# Patient Record
Sex: Male | Born: 1980 | Race: White | Hispanic: No | Marital: Single | Smoking: Current some day smoker
Health system: Southern US, Community
[De-identification: ages and names within clinical notes are randomized; demographics above are authoritative.]

## PROBLEM LIST (undated history)

## (undated) DIAGNOSIS — S52502A Unspecified fracture of the lower end of left radius, initial encounter for closed fracture: Secondary | ICD-10-CM

---

## 2014-04-10 ENCOUNTER — Emergency Department (HOSPITAL_COMMUNITY): Payer: No Typology Code available for payment source

## 2014-04-10 ENCOUNTER — Encounter (HOSPITAL_COMMUNITY): Payer: Self-pay | Admitting: Emergency Medicine

## 2014-04-10 ENCOUNTER — Emergency Department (HOSPITAL_COMMUNITY)
Admission: EM | Admit: 2014-04-10 | Discharge: 2014-04-10 | Disposition: A | Discharge status: Home / Self Care | Payer: No Typology Code available for payment source | Attending: Emergency Medicine | Admitting: Emergency Medicine

## 2014-04-10 DIAGNOSIS — S52509A Unspecified fracture of the lower end of unspecified radius, initial encounter for closed fracture: Secondary | ICD-10-CM | POA: Diagnosis not present

## 2014-04-10 DIAGNOSIS — S52609A Unspecified fracture of lower end of unspecified ulna, initial encounter for closed fracture: Principal | ICD-10-CM

## 2014-04-10 DIAGNOSIS — F17200 Nicotine dependence, unspecified, uncomplicated: Secondary | ICD-10-CM | POA: Diagnosis not present

## 2014-04-10 DIAGNOSIS — Y9389 Activity, other specified: Secondary | ICD-10-CM | POA: Insufficient documentation

## 2014-04-10 DIAGNOSIS — S52502A Unspecified fracture of the lower end of left radius, initial encounter for closed fracture: Secondary | ICD-10-CM

## 2014-04-10 DIAGNOSIS — S52612A Displaced fracture of left ulna styloid process, initial encounter for closed fracture: Secondary | ICD-10-CM

## 2014-04-10 DIAGNOSIS — S4980XA Other specified injuries of shoulder and upper arm, unspecified arm, initial encounter: Secondary | ICD-10-CM | POA: Insufficient documentation

## 2014-04-10 DIAGNOSIS — S46909A Unspecified injury of unspecified muscle, fascia and tendon at shoulder and upper arm level, unspecified arm, initial encounter: Secondary | ICD-10-CM | POA: Insufficient documentation

## 2014-04-10 DIAGNOSIS — Y92410 Unspecified street and highway as the place of occurrence of the external cause: Secondary | ICD-10-CM | POA: Diagnosis not present

## 2014-04-10 MED ORDER — HYDROCODONE-ACETAMINOPHEN 5-325 MG PO TABS
2.0000 | ORAL_TABLET | Freq: Once | ORAL | Status: AC
  Administered 2014-04-10: 2 via ORAL
  Filled 2014-04-10: qty 2

## 2014-04-10 MED ORDER — HYDROCODONE-ACETAMINOPHEN 5-325 MG PO TABS
2.0000 | ORAL_TABLET | ORAL | Status: DC | PRN
Start: 2014-04-10 — End: 2014-04-14
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
## 2014-04-10 NOTE — Discharge Instructions (Signed)
Follow up with Dr. Merlyn LotKuzma for further evaluation of your fracture. Take vicodin as needed for pain. Refer to attached documents for more information. Rest, ice and elevate your arm.

## 2014-04-10 NOTE — Progress Notes (Signed)
Orthopedic Tech Progress Note Patient Details:  Albert Mora 04-09-1981 [id number]  Ortho Devices Type of Ortho Device: Ace wrap;Volar splint Ortho Device/Splint Location: LUE Ortho Device/Splint Interventions: Ordered;Application   [name], [name] 04/10/2014, 8:41 PM
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
## 2014-04-10 NOTE — ED Notes (Signed)
Ortho Tech contacted to report order on arm splint.

## 2014-04-10 NOTE — ED Notes (Signed)
Declined W/C at D/C and was escorted to lobby by RN. 

## 2014-04-11 NOTE — ED Provider Notes (Signed)
Medical screening examination/treatment/procedure(s) were performed by non-physician practitioner and as supervising physician I was immediately available for consultation/collaboration.  Alohilani Levenhagen, MD 04/11/14 2022 

## 2014-04-14 ENCOUNTER — Ambulatory Visit
Admission: RE | Admit: 2014-04-14 | Discharge: 2014-04-14 | Disposition: A | Discharge status: Home / Self Care | Payer: Self-pay | Source: Ambulatory Visit | Attending: Orthopedic Surgery | Admitting: Orthopedic Surgery

## 2014-04-14 ENCOUNTER — Other Ambulatory Visit: Payer: Self-pay | Admitting: Orthopedic Surgery

## 2014-04-14 ENCOUNTER — Encounter (HOSPITAL_BASED_OUTPATIENT_CLINIC_OR_DEPARTMENT_OTHER): Payer: Self-pay | Admitting: *Deleted

## 2014-04-14 DIAGNOSIS — S52502P Unspecified fracture of the lower end of left radius, subsequent encounter for closed fracture with malunion: Secondary | ICD-10-CM

## 2014-04-15 ENCOUNTER — Encounter (HOSPITAL_BASED_OUTPATIENT_CLINIC_OR_DEPARTMENT_OTHER): Payer: Self-pay | Admitting: *Deleted

## 2014-04-15 ENCOUNTER — Ambulatory Visit (HOSPITAL_BASED_OUTPATIENT_CLINIC_OR_DEPARTMENT_OTHER): Payer: No Typology Code available for payment source | Admitting: Anesthesiology

## 2014-04-15 ENCOUNTER — Encounter (HOSPITAL_BASED_OUTPATIENT_CLINIC_OR_DEPARTMENT_OTHER)
Admission: RE | Disposition: A | Discharge status: Home / Self Care | Payer: Self-pay | Source: Ambulatory Visit | Attending: Orthopedic Surgery

## 2014-04-15 ENCOUNTER — Ambulatory Visit (HOSPITAL_BASED_OUTPATIENT_CLINIC_OR_DEPARTMENT_OTHER)
Admission: RE | Admit: 2014-04-15 | Discharge: 2014-04-15 | Disposition: A | Discharge status: Home / Self Care | Payer: No Typology Code available for payment source | Source: Ambulatory Visit | Attending: Orthopedic Surgery | Admitting: Orthopedic Surgery

## 2014-04-15 ENCOUNTER — Encounter (HOSPITAL_BASED_OUTPATIENT_CLINIC_OR_DEPARTMENT_OTHER): Payer: No Typology Code available for payment source | Admitting: Anesthesiology

## 2014-04-15 DIAGNOSIS — F17200 Nicotine dependence, unspecified, uncomplicated: Secondary | ICD-10-CM | POA: Insufficient documentation

## 2014-04-15 DIAGNOSIS — Y998 Other external cause status: Secondary | ICD-10-CM | POA: Diagnosis not present

## 2014-04-15 DIAGNOSIS — S52599A Other fractures of lower end of unspecified radius, initial encounter for closed fracture: Secondary | ICD-10-CM | POA: Insufficient documentation

## 2014-04-15 DIAGNOSIS — Y92410 Unspecified street and highway as the place of occurrence of the external cause: Secondary | ICD-10-CM | POA: Diagnosis not present

## 2014-04-15 HISTORY — DX: Unspecified fracture of the lower end of left radius, initial encounter for closed fracture: S52.502A

## 2014-04-15 HISTORY — PX: OPEN REDUCTION INTERNAL FIXATION (ORIF) DISTAL RADIAL FRACTURE: SHX5989

## 2014-04-15 SURGERY — OPEN REDUCTION INTERNAL FIXATION (ORIF) DISTAL RADIUS FRACTURE
Anesthesia: General | Site: Wrist | Laterality: Left

## 2014-04-15 MED ORDER — 0.9 % SODIUM CHLORIDE (POUR BTL) OPTIME
TOPICAL | Status: DC | PRN
  Administered 2014-04-15: 500 mL

## 2014-04-15 MED ORDER — FENTANYL CITRATE 0.05 MG/ML IJ SOLN
INTRAMUSCULAR | Status: AC
  Filled 2014-04-15: qty 4

## 2014-04-15 MED ORDER — CEFAZOLIN SODIUM-DEXTROSE 2-3 GM-% IV SOLR
2.0000 g | INTRAVENOUS | Status: AC
  Administered 2014-04-15: 2 g via INTRAVENOUS

## 2014-04-15 MED ORDER — MIDAZOLAM HCL 2 MG/2ML IJ SOLN
1.0000 mg | INTRAMUSCULAR | Status: DC | PRN
  Administered 2014-04-15: 2 mg via INTRAVENOUS

## 2014-04-15 MED ORDER — HYDROMORPHONE HCL PF 1 MG/ML IJ SOLN: 0.2500 mg | INTRAMUSCULAR | Status: DC | PRN

## 2014-04-15 MED ORDER — OXYCODONE HCL 5 MG/5ML PO SOLN: 5.0000 mg | Freq: Once | ORAL | Status: DC | PRN

## 2014-04-15 MED ORDER — MIDAZOLAM HCL 2 MG/2ML IJ SOLN
INTRAMUSCULAR | Status: AC
  Filled 2014-04-15: qty 2

## 2014-04-15 MED ORDER — FENTANYL CITRATE 0.05 MG/ML IJ SOLN
INTRAMUSCULAR | Status: AC
  Filled 2014-04-15: qty 2

## 2014-04-15 MED ORDER — DEXAMETHASONE SODIUM PHOSPHATE 4 MG/ML IJ SOLN
INTRAMUSCULAR | Status: DC | PRN
  Administered 2014-04-15: 10 mg via INTRAVENOUS

## 2014-04-15 MED ORDER — CEFAZOLIN SODIUM-DEXTROSE 2-3 GM-% IV SOLR
INTRAVENOUS | Status: AC
  Filled 2014-04-15: qty 50

## 2014-04-15 MED ORDER — ONDANSETRON HCL 4 MG/2ML IJ SOLN
INTRAMUSCULAR | Status: DC | PRN
  Administered 2014-04-15: 4 mg via INTRAVENOUS

## 2014-04-15 MED ORDER — LACTATED RINGERS IV SOLN
INTRAVENOUS | Status: DC
  Administered 2014-04-15: 13:00:00 via INTRAVENOUS

## 2014-04-15 MED ORDER — BUPIVACAINE-EPINEPHRINE (PF) 0.5% -1:200000 IJ SOLN
INTRAMUSCULAR | Status: DC | PRN
  Administered 2014-04-15: 25 mL via PERINEURAL

## 2014-04-15 MED ORDER — OXYCODONE-ACETAMINOPHEN 5-325 MG PO TABS: ORAL_TABLET | ORAL | Status: AC

## 2014-04-15 MED ORDER — FENTANYL CITRATE 0.05 MG/ML IJ SOLN
50.0000 ug | INTRAMUSCULAR | Status: DC | PRN
  Administered 2014-04-15: 100 ug via INTRAVENOUS

## 2014-04-15 MED ORDER — ONDANSETRON HCL 4 MG/2ML IJ SOLN: 4.0000 mg | Freq: Once | INTRAMUSCULAR | Status: DC | PRN

## 2014-04-15 MED ORDER — OXYCODONE HCL 5 MG PO TABS: 5.0000 mg | ORAL_TABLET | Freq: Once | ORAL | Status: DC | PRN

## 2014-04-15 MED ORDER — CHLORHEXIDINE GLUCONATE 4 % EX LIQD: 60.0000 mL | Freq: Once | CUTANEOUS | Status: DC

## 2014-04-15 MED ORDER — PROPOFOL 10 MG/ML IV BOLUS
INTRAVENOUS | Status: DC | PRN
  Administered 2014-04-15: 300 mg via INTRAVENOUS

## 2014-04-15 MED ORDER — OXYCODONE HCL 5 MG PO TABS
5.0000 mg | ORAL_TABLET | Freq: Once | ORAL | Status: DC | PRN
Start: 2014-04-15 — End: 2014-04-15

## 2014-04-15 MED ORDER — LIDOCAINE HCL (CARDIAC) 20 MG/ML IV SOLN
INTRAVENOUS | Status: DC | PRN
  Administered 2014-04-15 (×2): 80 mg via INTRAVENOUS

## 2014-04-15 SURGICAL SUPPLY — 74 items
BANDAGE ELASTIC 3 VELCRO ST LF (GAUZE/BANDAGES/DRESSINGS) ×2 IMPLANT
BENZOIN TINCTURE PRP APPL 2/3 (GAUZE/BANDAGES/DRESSINGS) ×2 IMPLANT
BIT DRILL 2.0 LNG QUCK RELEASE (BIT) ×1 IMPLANT
BIT DRILL 2.8X5 QR DISP (BIT) ×2 IMPLANT
BLADE MINI RND TIP GREEN BEAV (BLADE) IMPLANT
BLADE SURG 15 STRL LF DISP TIS (BLADE) ×2 IMPLANT
BLADE SURG 15 STRL SS (BLADE) ×2
BNDG ESMARK 4X9 LF (GAUZE/BANDAGES/DRESSINGS) ×2 IMPLANT
BNDG GAUZE ELAST 4 BULKY (GAUZE/BANDAGES/DRESSINGS) ×2 IMPLANT
CHLORAPREP W/TINT 26ML (MISCELLANEOUS) ×2 IMPLANT
CORDS BIPOLAR (ELECTRODE) ×2 IMPLANT
COVER MAYO STAND STRL (DRAPES) ×2 IMPLANT
COVER TABLE BACK 60X90 (DRAPES) ×2 IMPLANT
DRAPE EXTREMITY T 121X128X90 (DRAPE) ×2 IMPLANT
DRAPE OEC MINIVIEW 54X84 (DRAPES) ×2 IMPLANT
DRAPE SURG 17X23 STRL (DRAPES) ×2 IMPLANT
DRILL 2.0 LNG QUICK RELEASE (BIT) ×2
GAUZE SPONGE 4X4 12PLY STRL (GAUZE/BANDAGES/DRESSINGS) ×2 IMPLANT
GAUZE XEROFORM 1X8 LF (GAUZE/BANDAGES/DRESSINGS) ×2 IMPLANT
GLOVE BIO SURGEON STRL SZ7.5 (GLOVE) ×2 IMPLANT
GLOVE BIOGEL PI IND STRL 7.0 (GLOVE) ×1 IMPLANT
GLOVE BIOGEL PI IND STRL 8 (GLOVE) ×1 IMPLANT
GLOVE BIOGEL PI IND STRL 8.5 (GLOVE) ×1 IMPLANT
GLOVE BIOGEL PI INDICATOR 7.0 (GLOVE) ×1
GLOVE BIOGEL PI INDICATOR 8 (GLOVE) ×1
GLOVE BIOGEL PI INDICATOR 8.5 (GLOVE) ×1
GLOVE ECLIPSE 6.5 STRL STRAW (GLOVE) ×2 IMPLANT
GLOVE EXAM NITRILE MD LF STRL (GLOVE) ×2 IMPLANT
GLOVE SURG ORTHO 8.0 STRL STRW (GLOVE) ×2 IMPLANT
GOWN STRL REUS W/ TWL LRG LVL3 (GOWN DISPOSABLE) ×1 IMPLANT
GOWN STRL REUS W/TWL LRG LVL3 (GOWN DISPOSABLE) ×1
GOWN STRL REUS W/TWL XL LVL3 (GOWN DISPOSABLE) ×4 IMPLANT
GUIDEWIRE ORTHO 0.054X6 (WIRE) ×6 IMPLANT
K-WIRE .045X4 (WIRE) ×2 IMPLANT
NEEDLE HYPO 25X1 1.5 SAFETY (NEEDLE) IMPLANT
NS IRRIG 1000ML POUR BTL (IV SOLUTION) ×2 IMPLANT
PACK BASIN DAY SURGERY FS (CUSTOM PROCEDURE TRAY) ×2 IMPLANT
PAD CAST 3X4 CTTN HI CHSV (CAST SUPPLIES) ×1 IMPLANT
PADDING CAST ABS 4INX4YD NS (CAST SUPPLIES)
PADDING CAST ABS COTTON 4X4 ST (CAST SUPPLIES) IMPLANT
PADDING CAST COTTON 3X4 STRL (CAST SUPPLIES) ×1
PLATE ACULOC 2 VDR STD LT (Plate) ×2 IMPLANT
SCREW CORT FT 18X2.3XLCK HEX (Screw) ×1 IMPLANT
SCREW CORTICAL LOCKING 2.3X14M (Screw) ×2 IMPLANT
SCREW CORTICAL LOCKING 2.3X16M (Screw) ×1 IMPLANT
SCREW CORTICAL LOCKING 2.3X18M (Screw) ×3 IMPLANT
SCREW CORTICAL LOCKING 2.3X20M (Screw) ×2 IMPLANT
SCREW FX16X2.3XLCK SMTH NS CRT (Screw) ×1 IMPLANT
SCREW FX18X2.3XSMTH LCK NS CRT (Screw) ×2 IMPLANT
SCREW FX20X2.3XSMTH LCK NS CRT (Screw) ×2 IMPLANT
SCREW HEX 3.5X15 NLCKG STRL (Screw) ×1 IMPLANT
SCREW HEX 3.5X15MM (Screw) ×2 IMPLANT
SCREW HEXALOBE NON-LOCK 3.5X14 (Screw) ×2 IMPLANT
SCREW NONLOCK HEX 3.5X12 (Screw) ×2 IMPLANT
SLEEVE SCD COMPRESS KNEE MED (MISCELLANEOUS) ×2 IMPLANT
SLING ARM XL FOAM STRAP (SOFTGOODS) ×2 IMPLANT
SPLINT PLASTER CAST XFAST 3X15 (CAST SUPPLIES) ×10 IMPLANT
SPLINT PLASTER CAST XFAST 4X15 (CAST SUPPLIES) IMPLANT
SPLINT PLASTER XTRA FAST SET 4 (CAST SUPPLIES)
SPLINT PLASTER XTRA FASTSET 3X (CAST SUPPLIES) ×10
STOCKINETTE 4X48 STRL (DRAPES) ×2 IMPLANT
STRIP CLOSURE SKIN 1/2X4 (GAUZE/BANDAGES/DRESSINGS) ×2 IMPLANT
SUCTION FRAZIER TIP 10 FR DISP (SUCTIONS) IMPLANT
SUT ETHILON 3 0 PS 1 (SUTURE) IMPLANT
SUT ETHILON 4 0 PS 2 18 (SUTURE) ×4 IMPLANT
SUT MNCRL AB 4-0 PS2 18 (SUTURE) ×2 IMPLANT
SUT VIC AB 3-0 PS1 18 (SUTURE)
SUT VIC AB 3-0 PS1 18XBRD (SUTURE) IMPLANT
SUT VICRYL 4-0 PS2 18IN ABS (SUTURE) ×4 IMPLANT
SYR BULB 3OZ (MISCELLANEOUS) ×2 IMPLANT
SYR CONTROL 10ML LL (SYRINGE) IMPLANT
TOWEL OR 17X24 6PK STRL BLUE (TOWEL DISPOSABLE) ×4 IMPLANT
TUBE CONNECTING 20X1/4 (TUBING) IMPLANT
UNDERPAD 30X30 INCONTINENT (UNDERPADS AND DIAPERS) ×2 IMPLANT
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
## 2014-04-15 NOTE — Progress Notes (Signed)
  Assisted Dr. Crews with left, ultrasound guided, supraclavicular block. Side rails up, monitors on throughout procedure. See vital signs in flow sheet. Tolerated Procedure well. 

## 2014-04-15 NOTE — Op Note (Signed)
Intra-operative fluoroscopic images in the AP, lateral, and oblique views were taken and evaluated by myself.  Reduction and hardware placement were confirmed.  There was no intraarticular penetration of permanent hardware.  

## 2014-04-15 NOTE — Anesthesia Procedure Notes (Addendum)
Anesthesia Regional Block:  Supraclavicular block  Pre-Anesthetic Checklist: ,, timeout performed, Correct Patient, Correct Site, Correct Laterality, Correct Procedure, Correct Position, site marked, Risks and benefits discussed,  Surgical consent,  Pre-op evaluation,  At surgeon's request and post-op pain management  Laterality: Left and Upper  Prep: chloraprep       Needles:  Injection technique: Single-shot  Needle Type: Echogenic Stimulator Needle     Needle Length: 5cm 5 cm Needle Gauge: 21 and 21 G    Additional Needles:  Procedures: ultrasound guided (picture in chart) Supraclavicular block Narrative:  Start time: 04/15/2014 1:35 PM End time: 04/15/2014 1:39 PM Injection made incrementally with aspirations every 5 mL.  Performed by: Personally  Anesthesiologist: Sheldon Silvanavid Crews   Procedure Name: LMA Insertion Performed by: York GricePEARSON, Talissa Apple W Pre-anesthesia Checklist: Patient identified, Timeout performed, Emergency Drugs available, Suction available and Patient being monitored Patient Re-evaluated:Patient Re-evaluated prior to inductionOxygen Delivery Method: Circle system utilized Preoxygenation: Pre-oxygenation with 100% oxygen Intubation Type: IV induction Ventilation: Mask ventilation without difficulty LMA: LMA inserted LMA Size: 4.0 Number of attempts: 1 Placement Confirmation: positive ETCO2 Tube secured with: Tape Dental Injury: Teeth and Oropharynx as per pre-operative assessment

## 2014-04-15 NOTE — Anesthesia Postprocedure Evaluation (Signed)
  Anesthesia Post-op Note  Patient: Albert Mora  Procedure(s) Performed: Procedure(s): OPEN REDUCTION INTERNAL FIXATION (ORIF) LEFT DISTAL RADIUS (Left)  Patient Location: PACU  Anesthesia Type: General with regional block   Level of Consciousness: awake, alert  and oriented  Airway and Oxygen Therapy: Patient Spontanous Breathing  Post-op Pain: none  Post-op Assessment: Post-op Vital signs reviewed  Post-op Vital Signs: Reviewed  Last Vitals:  Filed Vitals:   04/15/14 1625  BP: 141/86  Pulse: 92  Temp:   Resp: 16    Complications: No apparent anesthesia complications
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
## 2014-04-15 NOTE — Transfer of Care (Signed)
Immediate Anesthesia Transfer of Care Note  Patient: Albert Mora  Procedure(s) Performed: Procedure(s): OPEN REDUCTION INTERNAL FIXATION (ORIF) LEFT DISTAL RADIUS (Left)  Patient Location: PACU  Anesthesia Type:General  Level of Consciousness: awake and sedated  Airway & Oxygen Therapy: Patient Spontanous Breathing and Patient connected to face mask oxygen  Post-op Assessment: Report given to PACU RN and Post -op Vital signs reviewed and stable  Post vital signs: Reviewed and stable  Complications: No apparent anesthesia complications

## 2014-04-15 NOTE — Op Note (Signed)
[id number] 

## 2014-04-15 NOTE — Discharge Instructions (Addendum)
[facility] Instructions °Hand Surgery ° °Wound Care: °Keep your hand elevated above the level of your heart.  Do not allow it to dangle by your side.  Keep the dressing dry and do not remove it unless your doctor advises you to do so.  He will usually change it at the time of your post-op visit.  Moving your fingers is advised to stimulate circulation but will depend on the site of your surgery.  If you have a splint applied, your doctor will advise you regarding movement. ° °Activity: °Do not drive or operate machinery today.  Rest today and then you may return to your normal activity and work as indicated by your physician. ° °Diet:  °Drink liquids today or eat a light diet.  You may resume a regular diet tomorrow.   ° °General expectations: °Pain for two to three days. °Fingers may become slightly swollen. ° °Call your doctor if any of the following occur: °Severe pain not relieved by pain medication. °Elevated temperature. °Dressing soaked with blood. °Inability to move fingers. °White or bluish color to fingers. ° ° °Regional Anesthesia Blocks ° °1. Numbness or the inability to move the "blocked" extremity may last from 3-48 hours after placement. The length of time depends on the medication injected and your individual response to the medication. If the numbness is not going away after 48 hours, call your surgeon. ° °2. The extremity that is blocked will need to be protected until the numbness is gone and the  Strength has returned. Because you cannot feel it, you will need to take extra care to avoid injury. Because it may be weak, you may have difficulty moving it or using it. You may not know what position it is in without looking at it while the block is in effect. ° °3. For blocks in the legs and feet, returning to weight bearing and walking needs to be done carefully. You will need to wait until the numbness is entirely gone and the strength has returned. You should be able to move your leg and foot  normally before you try and bear weight or walk. You will need someone to be with you when you first try to ensure you do not fall and possibly risk injury. ° °4. Bruising and tenderness at the needle site are common side effects and will resolve in a few days. ° °5. Persistent numbness or new problems with movement should be communicated to the surgeon or the [facility] ([phone])/ [facility] ([phone]). ° ° °Post Anesthesia Home Care Instructions ° °Activity: °Get plenty of rest for the remainder of the day. A responsible adult should stay with you for 24 hours following the procedure.  °For the next 24 hours, DO NOT: °-Drive a car °-Operate machinery °-Drink alcoholic beverages °-Take any medication unless instructed by your physician °-Make any legal decisions or sign important papers. ° °Meals: °Start with liquid foods such as gelatin or soup. Progress to regular foods as tolerated. Avoid greasy, spicy, heavy foods. If nausea and/or vomiting occur, drink only clear liquids until the nausea and/or vomiting subsides. Call your physician if vomiting continues. ° °Special Instructions/Symptoms: °Your throat may feel dry or sore from the anesthesia or the breathing tube placed in your throat during surgery. If this causes discomfort, gargle with warm salt water. The discomfort should disappear within 24 hours. ° °

## 2014-04-15 NOTE — Brief Op Note (Signed)
04/15/2014  3:50 PM  PATIENT:  Albert Mora  33 y.o. male  PRE-OPERATIVE DIAGNOSIS:  LEFT DISTAL RADIUS FRACTURE  POST-OPERATIVE DIAGNOSIS:  LEFT DISTAL RADIUS FRACTURE  PROCEDURE:  Procedure(s): OPEN REDUCTION INTERNAL FIXATION (ORIF) LEFT DISTAL RADIUS (Left)  SURGEON:  Surgeon(s) and Role:    * Betha LoaKevin Othelia Riederer, MD - Primary    * Cindee SaltGary Adar Rase, MD - Assisting  PHYSICIAN ASSISTANT:   ASSISTANTS: Cindee SaltGary Askari Kinley, MD   ANESTHESIA:   regional and general  EBL:  Total I/O In: 1700 [I.V.:1700] Out: -   BLOOD ADMINISTERED:none  DRAINS: none   LOCAL MEDICATIONS USED:  NONE  SPECIMEN:  No Specimen  DISPOSITION OF SPECIMEN:  N/A  COUNTS:  YES  TOURNIQUET:   Total Tourniquet Time Documented: Upper Arm (Left) - 73 minutes Total: Upper Arm (Left) - 73 minutes   DICTATION: .Other Dictation: Dictation Number [id number]  PLAN OF CARE: Discharge to home after PACU  PATIENT DISPOSITION:  PACU - hemodynamically stable.
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
## 2014-04-15 NOTE — Anesthesia Preprocedure Evaluation (Addendum)
Anesthesia Evaluation  Patient identified by MRN, date of birth, ID band Patient awake, Patient confused and Patient unresponsive    Reviewed: Allergy & Precautions, H&P , NPO status , Patient's Chart, lab work & pertinent test results  Airway Mallampati: I TM Distance: >3 FB Neck ROM: Full    Dental  (+) Teeth Intact, Dental Advisory Given   Pulmonary Current Smoker,  breath sounds clear to auscultation        Cardiovascular Rhythm:Regular Rate:Normal     Neuro/Psych    GI/Hepatic   Endo/Other    Renal/GU      Musculoskeletal   Abdominal   Peds  Hematology   Anesthesia Other Findings   Reproductive/Obstetrics                         Anesthesia Physical Anesthesia Plan  ASA: I  Anesthesia Plan: General   Post-op Pain Management:    Induction: Intravenous  Airway Management Planned: LMA  Additional Equipment:   Intra-op Plan:   Post-operative Plan: Extubation in OR  Informed Consent: I have reviewed the patients History and Physical, chart, labs and discussed the procedure including the risks, benefits and alternatives for the proposed anesthesia with the patient or authorized representative who has indicated his/her understanding and acceptance.   Dental advisory given  Plan Discussed with: CRNA, Anesthesiologist and Surgeon  Anesthesia Plan Comments:        Anesthesia Quick Evaluation

## 2014-04-17 ENCOUNTER — Encounter (HOSPITAL_BASED_OUTPATIENT_CLINIC_OR_DEPARTMENT_OTHER): Payer: Self-pay | Admitting: Orthopedic Surgery

## 2014-04-17 LAB — POCT HEMOGLOBIN-HEMACUE: HEMOGLOBIN: 16 g/dL (ref 13.0–17.0)
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
## 2021-01-13 ENCOUNTER — Inpatient Hospital Stay: Admit: 2021-01-13 | Discharge: 2021-01-19 | Primary: Person

## 2021-01-13 ENCOUNTER — Inpatient Hospital Stay: Admit: 2021-01-13 | Discharge: 2021-01-13 | Primary: Person

## 2021-01-13 DIAGNOSIS — Z01810 Encounter for preprocedural cardiovascular examination: Secondary | ICD-10-CM

## 2021-01-13 LAB — TRANSTHORACIC ECHO
LV Mass Index (BSA) by M-Mode: 176.4892
LVEDVi: 85.3717
LVEF by MOD Bi-plane: 65.5979
LVESVi: 29.3697

## 2021-01-13 MED ORDER — DOBUTAMINE 250 MG/250 ML (1 MG/ML) IN 5 % DEXTROSE INTRAVENOUS
250 | Freq: Once | INTRAVENOUS | Status: AC
Start: 2021-01-13 — End: 2021-01-13

## 2021-01-13 MED ORDER — ATROPINE 0.1 MG/ML INJECTION SYRINGE
0.1 mg/mL | INTRAMUSCULAR | Status: DC | PRN
Start: 2021-01-13 — End: 2021-01-14

## 2021-01-13 MED ORDER — SODIUM CHLORIDE 0.9 % INTRAVENOUS SOLUTION
0.9 | INTRAVENOUS | Status: DC
Start: 2021-01-13 — End: 2021-01-14
  Administered 2021-01-13: 17:00:00 via INTRAVENOUS

## 2021-01-13 MED ORDER — ATROPINE 0.1 MG/ML INJECTION SYRINGE
0.1 | INTRAMUSCULAR | Status: AC
Start: 2021-01-13 — End: 2021-01-13
  Administered 2021-01-13: 18:00:00 2 via INTRAVENOUS

## 2021-01-13 MED ORDER — DOBUTAMINE 250 MG/250 ML (1 MG/ML) IN 5 % DEXTROSE INTRAVENOUS
250 | Freq: Once | INTRAVENOUS | Status: AC
Start: 2021-01-13 — End: 2021-01-13
  Administered 2021-01-13: 17:00:00 via INTRAVENOUS

## 2021-01-13 MED ORDER — PERFLUTREN LIPID MICROSPHERES 1.1 MG/ML INTRAVENOUS SUSPENSION
1.1 | INTRAVENOUS | Status: AC
Start: 2021-01-13 — End: 2021-01-13

## 2021-01-13 MED ORDER — DOBUTAMINE 250 MG/250 ML (1 MG/ML) IN 5 % DEXTROSE INTRAVENOUS
250 | INTRAVENOUS | Status: AC
Start: 2021-01-13 — End: 2021-01-13
  Administered 2021-01-13: 17:00:00 442 via INTRAVENOUS

## 2021-01-13 MED FILL — ATROPINE 0.1 MG/ML INJECTION SYRINGE: 0.1 mg/mL | INTRAMUSCULAR | Qty: 10

## 2021-01-13 MED FILL — DOBUTAMINE 250 MG/250 ML (1 MG/ML) IN 5 % DEXTROSE INTRAVENOUS: 250 mg/ mL (1000 mcg/mL) | INTRAVENOUS | Qty: 250

## 2021-01-13 MED FILL — DEFINITY 1.1 MG/ML INTRAVENOUS SUSPENSION: 1.1 mg/mL | INTRAVENOUS | Qty: 2

## 2021-01-13 NOTE — H&P (Signed)
CARDIAC STRESS TEST PRE-PROCEDURE H&P      Referring Provider  Chandra Batch    Procedure  DSE    Procedure  DSE    CC/Indication  Pre Op ESRD    HPI  40 Y/O male with known ESRD who presents for ischemic evaluation in preparation for Renal Transplant. He denies a hx of CAD, Glaucoma or BPH. He endorses a hx of DM, HTN and HLD. No c/o CP or shortness of breath.     PMH  No past medical history on file.    Past Surgeries  No past surgical history on file.    Social Hx  Social History     Socioeconomic History    Marital status: Unknown/Declined     Spouse name: Not on file    Number of children: Not on file    Years of education: Not on file    Highest education level: Not on file   Occupational History    Not on file   Tobacco Use    Smoking status: Not on file    Smokeless tobacco: Not on file   Substance and Sexual Activity    Alcohol use: Not on file    Drug use: Not on file    Sexual activity: Not on file   Other Topics Concern    Not on file   Social History Narrative    Not on file     Social Determinants of Health     Financial Resource Strain:     Difficulty of Paying Living Expenses: Not on file   Food Insecurity:     Worried About Running Out of Food in the Last Year: Not on file    Ran Out of Food in the Last Year: Not on file   Transportation Needs:     Lack of Transportation (Medical): Not on file    Lack of Transportation (Non-Medical): Not on file       Family Hx  No known family hx of early heart disease    Allergies  Allergies/Contraindications  Not on File    Medications  Prior to Admission medications    Not on File       ROS  ROS     Physical Exam  Gen: NAD, Alert & oriented x 3  Cardiac: RRR, normal S1/S2, no m/r/g appreciated  Pulmonary: CTA bilaterally, no wheezes    Pertinent Data  No results found for this or any previous visit.        Plan  Proceed with DSE.     Consent  Risks (such as possible signs/symptoms including but not limited to CP, SOB, arrhythmias,  syncope, abnormal vitals, rarely a heart attack or cardiac complication); benefits; and alternatives of procedure (such as coronary angiogram) explained and consented.      Comments/Results  See Imaging/Cardiology tab for final report.      Nolene Ebbs, NP  [facility]

## 2021-01-15 LAB — ECG 12-LEAD
Atrial Rate: 67 {beats}/min
Calculated P Axis: 47 degrees
Calculated R Axis: 73 degrees
Calculated T Axis: 50 degrees
P-R Interval: 206 ms
QRS Duration: 106 ms
QT Interval: 480 ms
QTcb: 507 ms
Ventricular Rate: 67 {beats}/min

## 2021-01-15 LAB — ECG STRESS REPORT
Max Diastolic BP: 100 mm[Hg]
Max Heart Rate: 114 {beats}/min
Max Predicted Heart Rate: 181 {beats}/min
Max Work Load (METS*10): 10
Max. Systolic BP: 210 mm[Hg]
